# Patient Record
Sex: Male | Born: 1950 | Race: Black or African American | Hispanic: No | Marital: Married | Smoking: Former smoker
Health system: Southern US, Community
[De-identification: ages and names within clinical notes are randomized; demographics above are authoritative.]

## PROBLEM LIST (undated history)

## (undated) DIAGNOSIS — I10 Essential (primary) hypertension: Secondary | ICD-10-CM

## (undated) DIAGNOSIS — E119 Type 2 diabetes mellitus without complications: Secondary | ICD-10-CM

## (undated) DIAGNOSIS — E7800 Pure hypercholesterolemia, unspecified: Secondary | ICD-10-CM

---

## 2008-06-09 ENCOUNTER — Emergency Department: Payer: Self-pay | Admitting: Emergency Medicine

## 2008-06-10 ENCOUNTER — Inpatient Hospital Stay: Payer: Self-pay | Admitting: Urology

## 2008-07-03 ENCOUNTER — Ambulatory Visit: Payer: Self-pay | Admitting: Urology

## 2008-07-10 ENCOUNTER — Ambulatory Visit: Payer: Self-pay | Admitting: Urology

## 2008-07-25 ENCOUNTER — Ambulatory Visit: Payer: Self-pay | Admitting: Urology

## 2008-07-30 ENCOUNTER — Ambulatory Visit: Payer: Self-pay | Admitting: Urology

## 2008-09-03 ENCOUNTER — Ambulatory Visit: Payer: Self-pay | Admitting: Urology

## 2009-01-08 ENCOUNTER — Ambulatory Visit: Payer: Self-pay | Admitting: Urology

## 2009-04-24 ENCOUNTER — Ambulatory Visit: Payer: Self-pay | Admitting: Urology

## 2009-10-28 ENCOUNTER — Ambulatory Visit: Payer: Self-pay | Admitting: Urology

## 2009-12-14 ENCOUNTER — Ambulatory Visit: Payer: Self-pay | Admitting: Internal Medicine

## 2010-11-26 IMAGING — CR DG ABDOMEN 1V
1 series · 2 of 2 positions shown · non-contrast
Comparison: none

REASON FOR EXAM: calculus of kidney
COMMENTS:

[Series 1: view not recorded · 0.17mm/px · 2 of 2 slices shown]
[im 1/2]
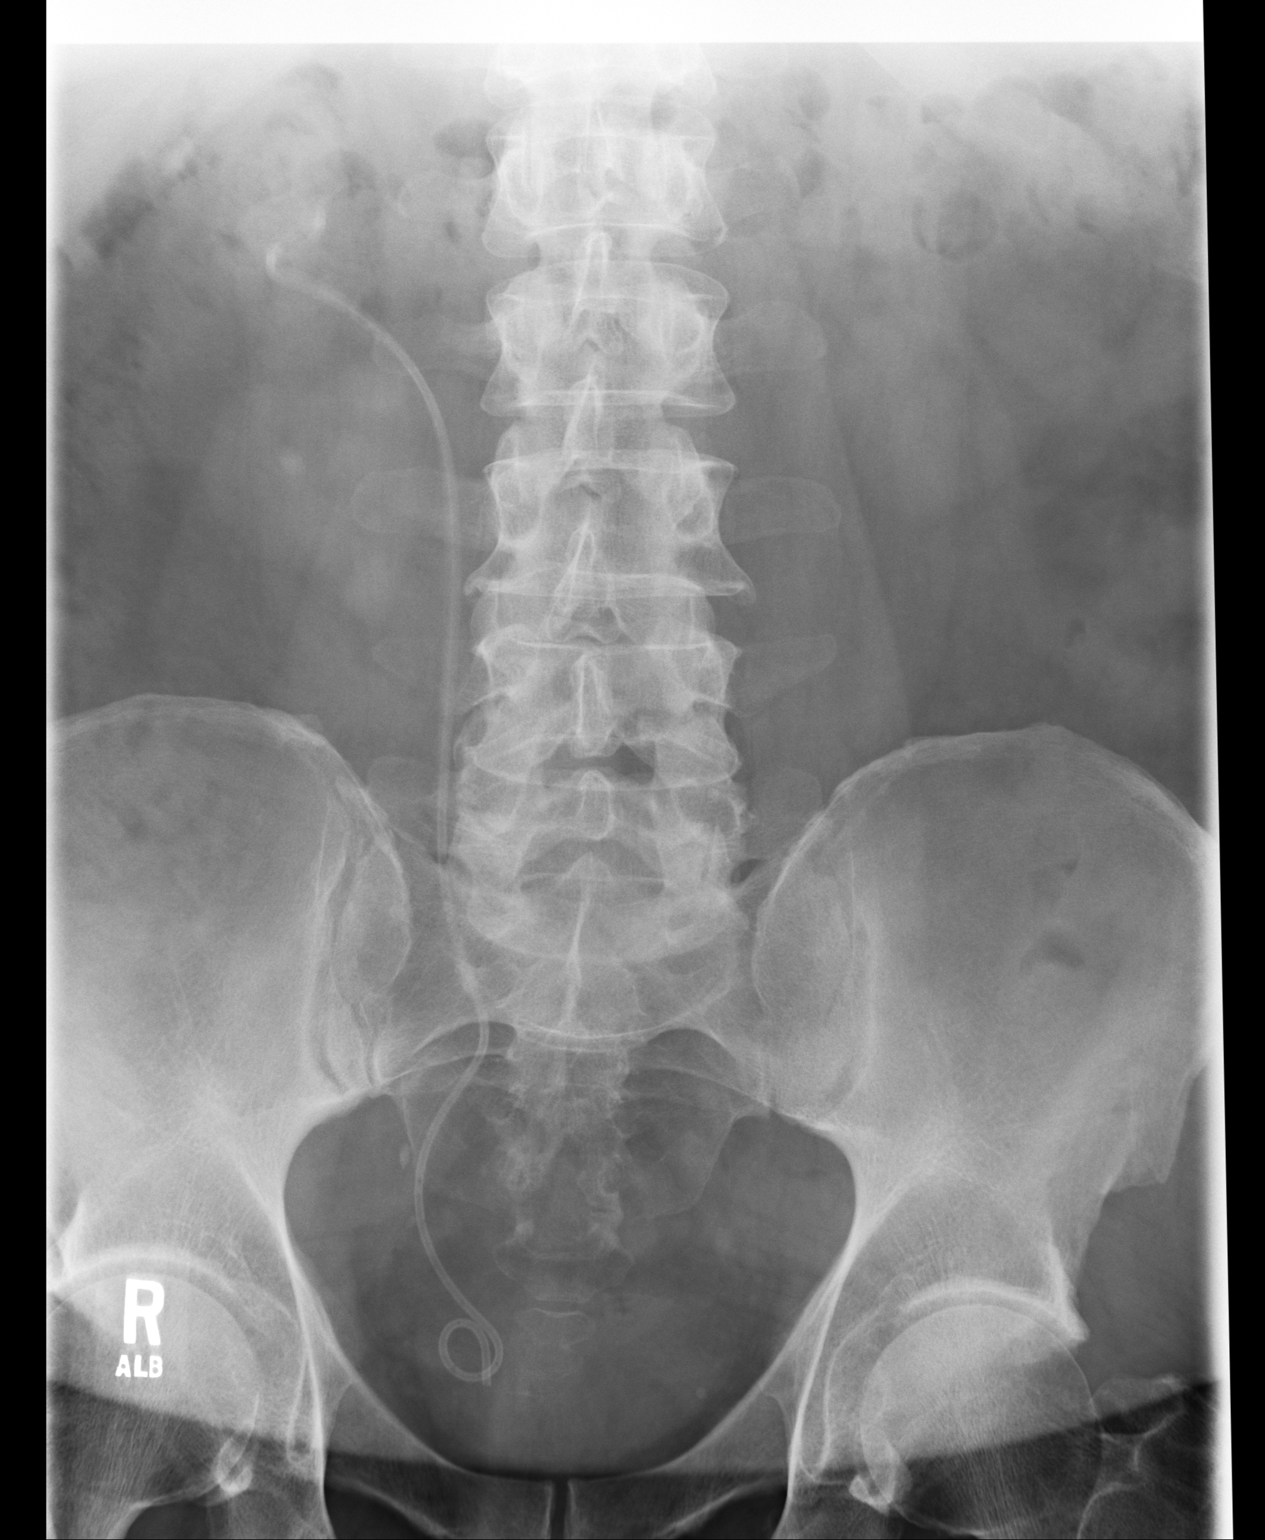
[im 2/2]
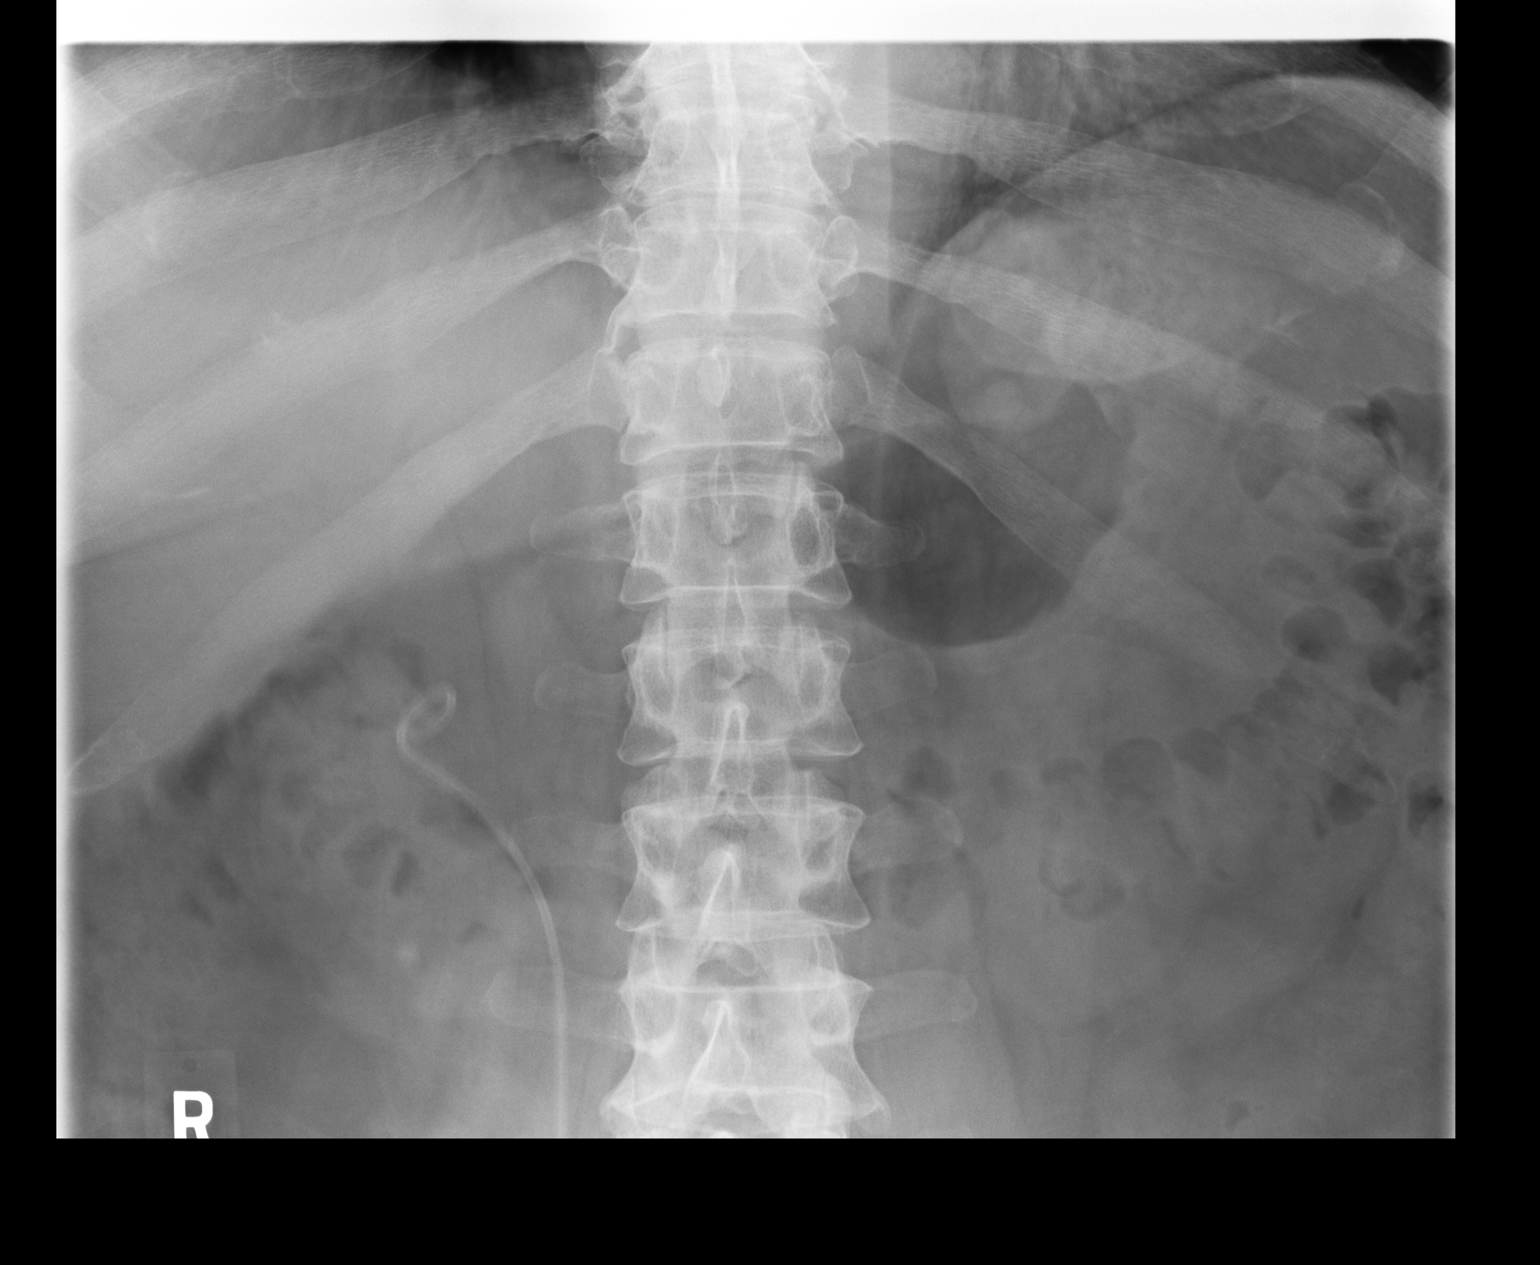

[2 of 2 positions shown; findings below may reference images not displayed]

PROCEDURE:     MDR - MDR KIDNEY URETER BLADDER  - July 03, 2008 [DATE]

RESULT:     Comparison is made to the prior exam of 06/12/2008.

A double-J stent is again noted on the right. There is a calcific density at
the lower pole of the right kidney compatible with a renal stone measuring 9
mm at maximum diameter. At the level of the sacrum, on the right, there is
an apparent density associated with the stent and likely representing a
distal right ureteral stone. No left renal or ureteral calcifications are
identified.
IMPRESSION: 1.  Right nephrolithiasis.
2.  Probable stone adjacent to the stent on the right at the level of the
upper sacrum.

## 2011-09-02 ENCOUNTER — Ambulatory Visit: Payer: Self-pay | Admitting: Urology

## 2013-09-05 DIAGNOSIS — E66813 Obesity, class 3: Secondary | ICD-10-CM | POA: Insufficient documentation

## 2013-09-05 DIAGNOSIS — IMO0002 Reserved for concepts with insufficient information to code with codable children: Secondary | ICD-10-CM | POA: Insufficient documentation

## 2013-12-02 DIAGNOSIS — M109 Gout, unspecified: Secondary | ICD-10-CM | POA: Insufficient documentation

## 2014-01-29 DIAGNOSIS — G4733 Obstructive sleep apnea (adult) (pediatric): Secondary | ICD-10-CM | POA: Insufficient documentation

## 2014-07-11 DIAGNOSIS — I10 Essential (primary) hypertension: Secondary | ICD-10-CM | POA: Insufficient documentation

## 2015-03-19 DIAGNOSIS — E1122 Type 2 diabetes mellitus with diabetic chronic kidney disease: Secondary | ICD-10-CM | POA: Diagnosis not present

## 2015-03-19 DIAGNOSIS — N183 Chronic kidney disease, stage 3 (moderate): Secondary | ICD-10-CM | POA: Diagnosis not present

## 2015-03-26 DIAGNOSIS — N183 Chronic kidney disease, stage 3 (moderate): Secondary | ICD-10-CM | POA: Diagnosis not present

## 2015-03-26 DIAGNOSIS — E1122 Type 2 diabetes mellitus with diabetic chronic kidney disease: Secondary | ICD-10-CM | POA: Diagnosis not present

## 2015-03-26 DIAGNOSIS — E785 Hyperlipidemia, unspecified: Secondary | ICD-10-CM | POA: Diagnosis not present

## 2015-06-08 DIAGNOSIS — I10 Essential (primary) hypertension: Secondary | ICD-10-CM | POA: Diagnosis not present

## 2015-06-08 DIAGNOSIS — N182 Chronic kidney disease, stage 2 (mild): Secondary | ICD-10-CM | POA: Diagnosis not present

## 2015-06-08 DIAGNOSIS — G252 Other specified forms of tremor: Secondary | ICD-10-CM | POA: Diagnosis not present

## 2015-06-08 DIAGNOSIS — E1122 Type 2 diabetes mellitus with diabetic chronic kidney disease: Secondary | ICD-10-CM | POA: Diagnosis not present

## 2015-06-08 DIAGNOSIS — E782 Mixed hyperlipidemia: Secondary | ICD-10-CM | POA: Diagnosis not present

## 2015-06-08 DIAGNOSIS — M1A0710 Idiopathic chronic gout, right ankle and foot, without tophus (tophi): Secondary | ICD-10-CM | POA: Diagnosis not present

## 2015-06-08 DIAGNOSIS — G4733 Obstructive sleep apnea (adult) (pediatric): Secondary | ICD-10-CM | POA: Diagnosis not present

## 2015-06-08 DIAGNOSIS — N183 Chronic kidney disease, stage 3 (moderate): Secondary | ICD-10-CM | POA: Diagnosis not present

## 2015-06-08 DIAGNOSIS — E1165 Type 2 diabetes mellitus with hyperglycemia: Secondary | ICD-10-CM | POA: Diagnosis not present

## 2015-06-25 DIAGNOSIS — E785 Hyperlipidemia, unspecified: Secondary | ICD-10-CM | POA: Diagnosis not present

## 2015-06-25 DIAGNOSIS — N183 Chronic kidney disease, stage 3 (moderate): Secondary | ICD-10-CM | POA: Diagnosis not present

## 2015-06-25 DIAGNOSIS — E1122 Type 2 diabetes mellitus with diabetic chronic kidney disease: Secondary | ICD-10-CM | POA: Diagnosis not present

## 2015-07-16 DIAGNOSIS — G250 Essential tremor: Secondary | ICD-10-CM | POA: Insufficient documentation

## 2015-07-16 DIAGNOSIS — R251 Tremor, unspecified: Secondary | ICD-10-CM | POA: Insufficient documentation

## 2015-09-21 DIAGNOSIS — N183 Chronic kidney disease, stage 3 (moderate): Secondary | ICD-10-CM | POA: Diagnosis not present

## 2015-09-21 DIAGNOSIS — E1122 Type 2 diabetes mellitus with diabetic chronic kidney disease: Secondary | ICD-10-CM | POA: Diagnosis not present

## 2015-09-29 DIAGNOSIS — E785 Hyperlipidemia, unspecified: Secondary | ICD-10-CM | POA: Diagnosis not present

## 2015-09-29 DIAGNOSIS — E1165 Type 2 diabetes mellitus with hyperglycemia: Secondary | ICD-10-CM | POA: Diagnosis not present

## 2015-09-29 DIAGNOSIS — E1122 Type 2 diabetes mellitus with diabetic chronic kidney disease: Secondary | ICD-10-CM | POA: Diagnosis not present

## 2015-09-29 DIAGNOSIS — N183 Chronic kidney disease, stage 3 (moderate): Secondary | ICD-10-CM | POA: Diagnosis not present

## 2015-12-09 DIAGNOSIS — M1A0710 Idiopathic chronic gout, right ankle and foot, without tophus (tophi): Secondary | ICD-10-CM | POA: Diagnosis not present

## 2015-12-09 DIAGNOSIS — G250 Essential tremor: Secondary | ICD-10-CM | POA: Diagnosis not present

## 2015-12-09 DIAGNOSIS — I10 Essential (primary) hypertension: Secondary | ICD-10-CM | POA: Diagnosis not present

## 2015-12-09 DIAGNOSIS — E1165 Type 2 diabetes mellitus with hyperglycemia: Secondary | ICD-10-CM | POA: Diagnosis not present

## 2015-12-09 DIAGNOSIS — E1122 Type 2 diabetes mellitus with diabetic chronic kidney disease: Secondary | ICD-10-CM | POA: Diagnosis not present

## 2015-12-09 DIAGNOSIS — E782 Mixed hyperlipidemia: Secondary | ICD-10-CM | POA: Diagnosis not present

## 2015-12-09 DIAGNOSIS — N182 Chronic kidney disease, stage 2 (mild): Secondary | ICD-10-CM | POA: Diagnosis not present

## 2015-12-22 DIAGNOSIS — E1165 Type 2 diabetes mellitus with hyperglycemia: Secondary | ICD-10-CM | POA: Diagnosis not present

## 2015-12-29 DIAGNOSIS — E785 Hyperlipidemia, unspecified: Secondary | ICD-10-CM | POA: Diagnosis not present

## 2015-12-29 DIAGNOSIS — N183 Chronic kidney disease, stage 3 (moderate): Secondary | ICD-10-CM | POA: Diagnosis not present

## 2015-12-29 DIAGNOSIS — E1165 Type 2 diabetes mellitus with hyperglycemia: Secondary | ICD-10-CM | POA: Diagnosis not present

## 2015-12-29 DIAGNOSIS — Z794 Long term (current) use of insulin: Secondary | ICD-10-CM | POA: Diagnosis not present

## 2015-12-29 DIAGNOSIS — E1122 Type 2 diabetes mellitus with diabetic chronic kidney disease: Secondary | ICD-10-CM | POA: Diagnosis not present

## 2016-01-14 DIAGNOSIS — G250 Essential tremor: Secondary | ICD-10-CM | POA: Diagnosis not present

## 2016-01-14 DIAGNOSIS — E1165 Type 2 diabetes mellitus with hyperglycemia: Secondary | ICD-10-CM | POA: Diagnosis not present

## 2016-03-22 DIAGNOSIS — E1165 Type 2 diabetes mellitus with hyperglycemia: Secondary | ICD-10-CM | POA: Diagnosis not present

## 2016-03-22 DIAGNOSIS — Z794 Long term (current) use of insulin: Secondary | ICD-10-CM | POA: Diagnosis not present

## 2016-03-29 DIAGNOSIS — E1165 Type 2 diabetes mellitus with hyperglycemia: Secondary | ICD-10-CM | POA: Diagnosis not present

## 2016-03-29 DIAGNOSIS — E785 Hyperlipidemia, unspecified: Secondary | ICD-10-CM | POA: Diagnosis not present

## 2016-03-29 DIAGNOSIS — E1122 Type 2 diabetes mellitus with diabetic chronic kidney disease: Secondary | ICD-10-CM | POA: Diagnosis not present

## 2016-03-29 DIAGNOSIS — N183 Chronic kidney disease, stage 3 (moderate): Secondary | ICD-10-CM | POA: Diagnosis not present

## 2016-04-18 DIAGNOSIS — E876 Hypokalemia: Secondary | ICD-10-CM | POA: Diagnosis not present

## 2016-06-08 DIAGNOSIS — Z125 Encounter for screening for malignant neoplasm of prostate: Secondary | ICD-10-CM | POA: Diagnosis not present

## 2016-06-08 DIAGNOSIS — E1122 Type 2 diabetes mellitus with diabetic chronic kidney disease: Secondary | ICD-10-CM | POA: Diagnosis not present

## 2016-06-08 DIAGNOSIS — E1165 Type 2 diabetes mellitus with hyperglycemia: Secondary | ICD-10-CM | POA: Diagnosis not present

## 2016-06-08 DIAGNOSIS — G4733 Obstructive sleep apnea (adult) (pediatric): Secondary | ICD-10-CM | POA: Diagnosis not present

## 2016-06-08 DIAGNOSIS — E782 Mixed hyperlipidemia: Secondary | ICD-10-CM | POA: Diagnosis not present

## 2016-06-08 DIAGNOSIS — M1A0710 Idiopathic chronic gout, right ankle and foot, without tophus (tophi): Secondary | ICD-10-CM | POA: Diagnosis not present

## 2016-06-08 DIAGNOSIS — N183 Chronic kidney disease, stage 3 (moderate): Secondary | ICD-10-CM | POA: Diagnosis not present

## 2016-06-08 DIAGNOSIS — I10 Essential (primary) hypertension: Secondary | ICD-10-CM | POA: Diagnosis not present

## 2016-06-08 DIAGNOSIS — G250 Essential tremor: Secondary | ICD-10-CM | POA: Diagnosis not present

## 2016-06-30 DIAGNOSIS — E1121 Type 2 diabetes mellitus with diabetic nephropathy: Secondary | ICD-10-CM | POA: Diagnosis not present

## 2016-06-30 DIAGNOSIS — I10 Essential (primary) hypertension: Secondary | ICD-10-CM | POA: Diagnosis not present

## 2016-07-05 DIAGNOSIS — E785 Hyperlipidemia, unspecified: Secondary | ICD-10-CM | POA: Diagnosis not present

## 2016-07-05 DIAGNOSIS — E1165 Type 2 diabetes mellitus with hyperglycemia: Secondary | ICD-10-CM | POA: Diagnosis not present

## 2016-07-05 DIAGNOSIS — N183 Chronic kidney disease, stage 3 (moderate): Secondary | ICD-10-CM | POA: Diagnosis not present

## 2016-07-05 DIAGNOSIS — E1122 Type 2 diabetes mellitus with diabetic chronic kidney disease: Secondary | ICD-10-CM | POA: Diagnosis not present

## 2016-07-14 DIAGNOSIS — G250 Essential tremor: Secondary | ICD-10-CM | POA: Diagnosis not present

## 2016-09-26 DIAGNOSIS — E1122 Type 2 diabetes mellitus with diabetic chronic kidney disease: Secondary | ICD-10-CM | POA: Diagnosis not present

## 2016-09-26 DIAGNOSIS — N183 Chronic kidney disease, stage 3 (moderate): Secondary | ICD-10-CM | POA: Diagnosis not present

## 2016-10-03 DIAGNOSIS — E1122 Type 2 diabetes mellitus with diabetic chronic kidney disease: Secondary | ICD-10-CM | POA: Diagnosis not present

## 2016-10-03 DIAGNOSIS — E785 Hyperlipidemia, unspecified: Secondary | ICD-10-CM | POA: Diagnosis not present

## 2016-10-03 DIAGNOSIS — N183 Chronic kidney disease, stage 3 (moderate): Secondary | ICD-10-CM | POA: Diagnosis not present

## 2016-10-06 DIAGNOSIS — G250 Essential tremor: Secondary | ICD-10-CM | POA: Diagnosis not present

## 2016-12-09 DIAGNOSIS — R972 Elevated prostate specific antigen [PSA]: Secondary | ICD-10-CM | POA: Diagnosis not present

## 2016-12-09 DIAGNOSIS — E1122 Type 2 diabetes mellitus with diabetic chronic kidney disease: Secondary | ICD-10-CM | POA: Diagnosis not present

## 2016-12-09 DIAGNOSIS — M1A0710 Idiopathic chronic gout, right ankle and foot, without tophus (tophi): Secondary | ICD-10-CM | POA: Diagnosis not present

## 2016-12-09 DIAGNOSIS — G250 Essential tremor: Secondary | ICD-10-CM | POA: Diagnosis not present

## 2016-12-09 DIAGNOSIS — I10 Essential (primary) hypertension: Secondary | ICD-10-CM | POA: Diagnosis not present

## 2016-12-09 DIAGNOSIS — Z23 Encounter for immunization: Secondary | ICD-10-CM | POA: Diagnosis not present

## 2016-12-09 DIAGNOSIS — G4733 Obstructive sleep apnea (adult) (pediatric): Secondary | ICD-10-CM | POA: Diagnosis not present

## 2016-12-09 DIAGNOSIS — Z1211 Encounter for screening for malignant neoplasm of colon: Secondary | ICD-10-CM | POA: Diagnosis not present

## 2016-12-09 DIAGNOSIS — E782 Mixed hyperlipidemia: Secondary | ICD-10-CM | POA: Diagnosis not present

## 2016-12-28 DIAGNOSIS — Z1211 Encounter for screening for malignant neoplasm of colon: Secondary | ICD-10-CM | POA: Diagnosis not present

## 2017-01-05 DIAGNOSIS — E8881 Metabolic syndrome: Secondary | ICD-10-CM | POA: Diagnosis not present

## 2017-01-05 DIAGNOSIS — E1122 Type 2 diabetes mellitus with diabetic chronic kidney disease: Secondary | ICD-10-CM | POA: Diagnosis not present

## 2017-01-05 DIAGNOSIS — G479 Sleep disorder, unspecified: Secondary | ICD-10-CM | POA: Insufficient documentation

## 2017-01-05 DIAGNOSIS — E785 Hyperlipidemia, unspecified: Secondary | ICD-10-CM | POA: Diagnosis not present

## 2017-01-05 DIAGNOSIS — G250 Essential tremor: Secondary | ICD-10-CM | POA: Diagnosis not present

## 2017-01-05 DIAGNOSIS — N183 Chronic kidney disease, stage 3 (moderate): Secondary | ICD-10-CM | POA: Diagnosis not present

## 2017-01-05 DIAGNOSIS — Z6841 Body Mass Index (BMI) 40.0 and over, adult: Secondary | ICD-10-CM | POA: Diagnosis not present

## 2017-04-07 DIAGNOSIS — N183 Chronic kidney disease, stage 3 (moderate): Secondary | ICD-10-CM | POA: Diagnosis not present

## 2017-04-07 DIAGNOSIS — E1165 Type 2 diabetes mellitus with hyperglycemia: Secondary | ICD-10-CM | POA: Diagnosis not present

## 2017-04-07 DIAGNOSIS — E1122 Type 2 diabetes mellitus with diabetic chronic kidney disease: Secondary | ICD-10-CM | POA: Diagnosis not present

## 2017-04-07 DIAGNOSIS — E8881 Metabolic syndrome: Secondary | ICD-10-CM | POA: Diagnosis not present

## 2017-04-07 DIAGNOSIS — Z6841 Body Mass Index (BMI) 40.0 and over, adult: Secondary | ICD-10-CM | POA: Diagnosis not present

## 2017-04-07 DIAGNOSIS — E785 Hyperlipidemia, unspecified: Secondary | ICD-10-CM | POA: Diagnosis not present

## 2017-04-07 DIAGNOSIS — E1129 Type 2 diabetes mellitus with other diabetic kidney complication: Secondary | ICD-10-CM | POA: Diagnosis not present

## 2017-06-08 DIAGNOSIS — Z6841 Body Mass Index (BMI) 40.0 and over, adult: Secondary | ICD-10-CM | POA: Diagnosis not present

## 2017-06-08 DIAGNOSIS — Z0000 Encounter for general adult medical examination without abnormal findings: Secondary | ICD-10-CM | POA: Diagnosis not present

## 2017-06-08 DIAGNOSIS — G250 Essential tremor: Secondary | ICD-10-CM | POA: Diagnosis not present

## 2017-06-08 DIAGNOSIS — I10 Essential (primary) hypertension: Secondary | ICD-10-CM | POA: Diagnosis not present

## 2017-06-08 DIAGNOSIS — R972 Elevated prostate specific antigen [PSA]: Secondary | ICD-10-CM | POA: Diagnosis not present

## 2017-06-08 DIAGNOSIS — M1A0710 Idiopathic chronic gout, right ankle and foot, without tophus (tophi): Secondary | ICD-10-CM | POA: Diagnosis not present

## 2017-06-08 DIAGNOSIS — G4733 Obstructive sleep apnea (adult) (pediatric): Secondary | ICD-10-CM | POA: Diagnosis not present

## 2017-06-08 DIAGNOSIS — E1122 Type 2 diabetes mellitus with diabetic chronic kidney disease: Secondary | ICD-10-CM | POA: Diagnosis not present

## 2017-06-08 DIAGNOSIS — E782 Mixed hyperlipidemia: Secondary | ICD-10-CM | POA: Diagnosis not present

## 2017-07-06 DIAGNOSIS — G479 Sleep disorder, unspecified: Secondary | ICD-10-CM | POA: Diagnosis not present

## 2017-07-06 DIAGNOSIS — G250 Essential tremor: Secondary | ICD-10-CM | POA: Diagnosis not present

## 2017-09-01 DIAGNOSIS — N183 Chronic kidney disease, stage 3 (moderate): Secondary | ICD-10-CM | POA: Diagnosis not present

## 2017-09-01 DIAGNOSIS — E782 Mixed hyperlipidemia: Secondary | ICD-10-CM | POA: Diagnosis not present

## 2017-09-01 DIAGNOSIS — E1122 Type 2 diabetes mellitus with diabetic chronic kidney disease: Secondary | ICD-10-CM | POA: Diagnosis not present

## 2017-09-01 DIAGNOSIS — I129 Hypertensive chronic kidney disease with stage 1 through stage 4 chronic kidney disease, or unspecified chronic kidney disease: Secondary | ICD-10-CM | POA: Diagnosis not present

## 2017-09-01 DIAGNOSIS — E1165 Type 2 diabetes mellitus with hyperglycemia: Secondary | ICD-10-CM | POA: Diagnosis not present

## 2017-12-01 DIAGNOSIS — E785 Hyperlipidemia, unspecified: Secondary | ICD-10-CM | POA: Diagnosis not present

## 2017-12-01 DIAGNOSIS — E1165 Type 2 diabetes mellitus with hyperglycemia: Secondary | ICD-10-CM | POA: Diagnosis not present

## 2017-12-01 DIAGNOSIS — I129 Hypertensive chronic kidney disease with stage 1 through stage 4 chronic kidney disease, or unspecified chronic kidney disease: Secondary | ICD-10-CM | POA: Diagnosis not present

## 2017-12-01 DIAGNOSIS — E1122 Type 2 diabetes mellitus with diabetic chronic kidney disease: Secondary | ICD-10-CM | POA: Diagnosis not present

## 2017-12-01 DIAGNOSIS — E1169 Type 2 diabetes mellitus with other specified complication: Secondary | ICD-10-CM | POA: Diagnosis not present

## 2017-12-01 DIAGNOSIS — N183 Chronic kidney disease, stage 3 (moderate): Secondary | ICD-10-CM | POA: Diagnosis not present

## 2017-12-12 DIAGNOSIS — E785 Hyperlipidemia, unspecified: Secondary | ICD-10-CM | POA: Insufficient documentation

## 2017-12-12 DIAGNOSIS — N183 Chronic kidney disease, stage 3 unspecified: Secondary | ICD-10-CM | POA: Insufficient documentation

## 2017-12-12 DIAGNOSIS — E1122 Type 2 diabetes mellitus with diabetic chronic kidney disease: Secondary | ICD-10-CM | POA: Insufficient documentation

## 2017-12-12 DIAGNOSIS — E1169 Type 2 diabetes mellitus with other specified complication: Secondary | ICD-10-CM | POA: Insufficient documentation

## 2017-12-13 DIAGNOSIS — E1122 Type 2 diabetes mellitus with diabetic chronic kidney disease: Secondary | ICD-10-CM | POA: Diagnosis not present

## 2017-12-13 DIAGNOSIS — G250 Essential tremor: Secondary | ICD-10-CM | POA: Diagnosis not present

## 2017-12-13 DIAGNOSIS — N183 Chronic kidney disease, stage 3 (moderate): Secondary | ICD-10-CM | POA: Diagnosis not present

## 2017-12-13 DIAGNOSIS — I10 Essential (primary) hypertension: Secondary | ICD-10-CM | POA: Diagnosis not present

## 2017-12-13 DIAGNOSIS — Z6841 Body Mass Index (BMI) 40.0 and over, adult: Secondary | ICD-10-CM | POA: Diagnosis not present

## 2017-12-13 DIAGNOSIS — E1165 Type 2 diabetes mellitus with hyperglycemia: Secondary | ICD-10-CM | POA: Diagnosis not present

## 2017-12-13 DIAGNOSIS — M1A0710 Idiopathic chronic gout, right ankle and foot, without tophus (tophi): Secondary | ICD-10-CM | POA: Diagnosis not present

## 2017-12-13 DIAGNOSIS — G4733 Obstructive sleep apnea (adult) (pediatric): Secondary | ICD-10-CM | POA: Diagnosis not present

## 2017-12-13 DIAGNOSIS — E782 Mixed hyperlipidemia: Secondary | ICD-10-CM | POA: Diagnosis not present

## 2018-01-11 DIAGNOSIS — G250 Essential tremor: Secondary | ICD-10-CM | POA: Diagnosis not present

## 2018-01-11 DIAGNOSIS — G479 Sleep disorder, unspecified: Secondary | ICD-10-CM | POA: Diagnosis not present

## 2018-03-09 DIAGNOSIS — E785 Hyperlipidemia, unspecified: Secondary | ICD-10-CM | POA: Diagnosis not present

## 2018-03-09 DIAGNOSIS — E1122 Type 2 diabetes mellitus with diabetic chronic kidney disease: Secondary | ICD-10-CM | POA: Diagnosis not present

## 2018-03-09 DIAGNOSIS — E1165 Type 2 diabetes mellitus with hyperglycemia: Secondary | ICD-10-CM | POA: Diagnosis not present

## 2018-03-09 DIAGNOSIS — E1169 Type 2 diabetes mellitus with other specified complication: Secondary | ICD-10-CM | POA: Diagnosis not present

## 2018-04-19 DIAGNOSIS — G479 Sleep disorder, unspecified: Secondary | ICD-10-CM | POA: Diagnosis not present

## 2018-04-19 DIAGNOSIS — G250 Essential tremor: Secondary | ICD-10-CM | POA: Diagnosis not present

## 2018-05-31 DIAGNOSIS — E1122 Type 2 diabetes mellitus with diabetic chronic kidney disease: Secondary | ICD-10-CM | POA: Diagnosis not present

## 2018-05-31 DIAGNOSIS — E1169 Type 2 diabetes mellitus with other specified complication: Secondary | ICD-10-CM | POA: Diagnosis not present

## 2018-05-31 DIAGNOSIS — E1165 Type 2 diabetes mellitus with hyperglycemia: Secondary | ICD-10-CM | POA: Diagnosis not present

## 2018-05-31 DIAGNOSIS — E785 Hyperlipidemia, unspecified: Secondary | ICD-10-CM | POA: Diagnosis not present

## 2018-06-08 DIAGNOSIS — N183 Chronic kidney disease, stage 3 (moderate): Secondary | ICD-10-CM | POA: Diagnosis not present

## 2018-06-08 DIAGNOSIS — I129 Hypertensive chronic kidney disease with stage 1 through stage 4 chronic kidney disease, or unspecified chronic kidney disease: Secondary | ICD-10-CM | POA: Diagnosis not present

## 2018-06-08 DIAGNOSIS — E1122 Type 2 diabetes mellitus with diabetic chronic kidney disease: Secondary | ICD-10-CM | POA: Diagnosis not present

## 2018-06-08 DIAGNOSIS — E1169 Type 2 diabetes mellitus with other specified complication: Secondary | ICD-10-CM | POA: Diagnosis not present

## 2018-06-08 DIAGNOSIS — E1165 Type 2 diabetes mellitus with hyperglycemia: Secondary | ICD-10-CM | POA: Diagnosis not present

## 2018-06-08 DIAGNOSIS — E785 Hyperlipidemia, unspecified: Secondary | ICD-10-CM | POA: Diagnosis not present

## 2018-06-12 DIAGNOSIS — Z6841 Body Mass Index (BMI) 40.0 and over, adult: Secondary | ICD-10-CM | POA: Diagnosis not present

## 2018-06-12 DIAGNOSIS — G250 Essential tremor: Secondary | ICD-10-CM | POA: Diagnosis not present

## 2018-06-12 DIAGNOSIS — E782 Mixed hyperlipidemia: Secondary | ICD-10-CM | POA: Diagnosis not present

## 2018-06-12 DIAGNOSIS — Z0000 Encounter for general adult medical examination without abnormal findings: Secondary | ICD-10-CM | POA: Diagnosis not present

## 2018-06-12 DIAGNOSIS — G4733 Obstructive sleep apnea (adult) (pediatric): Secondary | ICD-10-CM | POA: Diagnosis not present

## 2018-06-12 DIAGNOSIS — Z125 Encounter for screening for malignant neoplasm of prostate: Secondary | ICD-10-CM | POA: Diagnosis not present

## 2018-06-12 DIAGNOSIS — I10 Essential (primary) hypertension: Secondary | ICD-10-CM | POA: Diagnosis not present

## 2018-06-12 DIAGNOSIS — M1A0710 Idiopathic chronic gout, right ankle and foot, without tophus (tophi): Secondary | ICD-10-CM | POA: Diagnosis not present

## 2018-06-12 DIAGNOSIS — E1122 Type 2 diabetes mellitus with diabetic chronic kidney disease: Secondary | ICD-10-CM | POA: Diagnosis not present

## 2018-08-16 DIAGNOSIS — G250 Essential tremor: Secondary | ICD-10-CM | POA: Diagnosis not present

## 2018-09-18 DIAGNOSIS — E1165 Type 2 diabetes mellitus with hyperglycemia: Secondary | ICD-10-CM | POA: Diagnosis not present

## 2018-09-18 DIAGNOSIS — N183 Chronic kidney disease, stage 3 (moderate): Secondary | ICD-10-CM | POA: Diagnosis not present

## 2018-09-18 DIAGNOSIS — E785 Hyperlipidemia, unspecified: Secondary | ICD-10-CM | POA: Diagnosis not present

## 2018-09-18 DIAGNOSIS — I129 Hypertensive chronic kidney disease with stage 1 through stage 4 chronic kidney disease, or unspecified chronic kidney disease: Secondary | ICD-10-CM | POA: Diagnosis not present

## 2018-09-18 DIAGNOSIS — E1169 Type 2 diabetes mellitus with other specified complication: Secondary | ICD-10-CM | POA: Diagnosis not present

## 2018-09-18 DIAGNOSIS — E1122 Type 2 diabetes mellitus with diabetic chronic kidney disease: Secondary | ICD-10-CM | POA: Diagnosis not present

## 2018-12-13 DIAGNOSIS — Z23 Encounter for immunization: Secondary | ICD-10-CM | POA: Diagnosis not present

## 2018-12-13 DIAGNOSIS — R195 Other fecal abnormalities: Secondary | ICD-10-CM | POA: Diagnosis not present

## 2018-12-13 DIAGNOSIS — M1A0710 Idiopathic chronic gout, right ankle and foot, without tophus (tophi): Secondary | ICD-10-CM | POA: Diagnosis not present

## 2018-12-13 DIAGNOSIS — I129 Hypertensive chronic kidney disease with stage 1 through stage 4 chronic kidney disease, or unspecified chronic kidney disease: Secondary | ICD-10-CM | POA: Diagnosis not present

## 2018-12-13 DIAGNOSIS — N183 Chronic kidney disease, stage 3 (moderate): Secondary | ICD-10-CM | POA: Diagnosis not present

## 2018-12-13 DIAGNOSIS — E1122 Type 2 diabetes mellitus with diabetic chronic kidney disease: Secondary | ICD-10-CM | POA: Diagnosis not present

## 2018-12-13 DIAGNOSIS — Z6841 Body Mass Index (BMI) 40.0 and over, adult: Secondary | ICD-10-CM | POA: Diagnosis not present

## 2018-12-13 DIAGNOSIS — G4733 Obstructive sleep apnea (adult) (pediatric): Secondary | ICD-10-CM | POA: Diagnosis not present

## 2018-12-25 DIAGNOSIS — E1165 Type 2 diabetes mellitus with hyperglycemia: Secondary | ICD-10-CM | POA: Diagnosis not present

## 2018-12-25 DIAGNOSIS — E785 Hyperlipidemia, unspecified: Secondary | ICD-10-CM | POA: Diagnosis not present

## 2018-12-25 DIAGNOSIS — E1169 Type 2 diabetes mellitus with other specified complication: Secondary | ICD-10-CM | POA: Diagnosis not present

## 2018-12-25 DIAGNOSIS — E1122 Type 2 diabetes mellitus with diabetic chronic kidney disease: Secondary | ICD-10-CM | POA: Diagnosis not present

## 2019-02-21 DIAGNOSIS — G250 Essential tremor: Secondary | ICD-10-CM | POA: Diagnosis not present

## 2019-02-21 DIAGNOSIS — G479 Sleep disorder, unspecified: Secondary | ICD-10-CM | POA: Diagnosis not present

## 2019-06-12 DIAGNOSIS — E1122 Type 2 diabetes mellitus with diabetic chronic kidney disease: Secondary | ICD-10-CM | POA: Diagnosis not present

## 2019-06-12 DIAGNOSIS — Z87891 Personal history of nicotine dependence: Secondary | ICD-10-CM | POA: Diagnosis not present

## 2019-06-12 DIAGNOSIS — Z6841 Body Mass Index (BMI) 40.0 and over, adult: Secondary | ICD-10-CM | POA: Diagnosis not present

## 2019-06-12 DIAGNOSIS — G4733 Obstructive sleep apnea (adult) (pediatric): Secondary | ICD-10-CM | POA: Diagnosis not present

## 2019-06-12 DIAGNOSIS — E782 Mixed hyperlipidemia: Secondary | ICD-10-CM | POA: Diagnosis not present

## 2019-06-12 DIAGNOSIS — N183 Chronic kidney disease, stage 3 unspecified: Secondary | ICD-10-CM | POA: Diagnosis not present

## 2019-06-12 DIAGNOSIS — I129 Hypertensive chronic kidney disease with stage 1 through stage 4 chronic kidney disease, or unspecified chronic kidney disease: Secondary | ICD-10-CM | POA: Diagnosis not present

## 2019-06-12 DIAGNOSIS — M1A0710 Idiopathic chronic gout, right ankle and foot, without tophus (tophi): Secondary | ICD-10-CM | POA: Diagnosis not present

## 2019-06-12 DIAGNOSIS — Z0000 Encounter for general adult medical examination without abnormal findings: Secondary | ICD-10-CM | POA: Diagnosis not present

## 2019-06-25 DIAGNOSIS — I129 Hypertensive chronic kidney disease with stage 1 through stage 4 chronic kidney disease, or unspecified chronic kidney disease: Secondary | ICD-10-CM | POA: Diagnosis not present

## 2019-06-25 DIAGNOSIS — N183 Chronic kidney disease, stage 3 unspecified: Secondary | ICD-10-CM | POA: Diagnosis not present

## 2019-06-25 DIAGNOSIS — E1165 Type 2 diabetes mellitus with hyperglycemia: Secondary | ICD-10-CM | POA: Diagnosis not present

## 2019-06-25 DIAGNOSIS — E785 Hyperlipidemia, unspecified: Secondary | ICD-10-CM | POA: Diagnosis not present

## 2019-06-25 DIAGNOSIS — E1169 Type 2 diabetes mellitus with other specified complication: Secondary | ICD-10-CM | POA: Diagnosis not present

## 2019-06-25 DIAGNOSIS — R7989 Other specified abnormal findings of blood chemistry: Secondary | ICD-10-CM | POA: Diagnosis not present

## 2019-06-25 DIAGNOSIS — E1122 Type 2 diabetes mellitus with diabetic chronic kidney disease: Secondary | ICD-10-CM | POA: Diagnosis not present

## 2019-07-24 DIAGNOSIS — E1122 Type 2 diabetes mellitus with diabetic chronic kidney disease: Secondary | ICD-10-CM | POA: Insufficient documentation

## 2019-07-24 DIAGNOSIS — N183 Chronic kidney disease, stage 3 unspecified: Secondary | ICD-10-CM | POA: Insufficient documentation

## 2019-07-25 DIAGNOSIS — N1832 Chronic kidney disease, stage 3b: Secondary | ICD-10-CM | POA: Diagnosis not present

## 2019-07-25 DIAGNOSIS — I10 Essential (primary) hypertension: Secondary | ICD-10-CM | POA: Diagnosis not present

## 2019-07-25 DIAGNOSIS — D631 Anemia in chronic kidney disease: Secondary | ICD-10-CM | POA: Diagnosis not present

## 2019-07-25 DIAGNOSIS — E1122 Type 2 diabetes mellitus with diabetic chronic kidney disease: Secondary | ICD-10-CM | POA: Diagnosis not present

## 2019-07-26 ENCOUNTER — Other Ambulatory Visit: Payer: Self-pay | Admitting: Nephrology

## 2019-07-26 DIAGNOSIS — E1122 Type 2 diabetes mellitus with diabetic chronic kidney disease: Secondary | ICD-10-CM

## 2019-07-26 DIAGNOSIS — N1832 Chronic kidney disease, stage 3b: Secondary | ICD-10-CM

## 2019-08-02 ENCOUNTER — Ambulatory Visit
Admission: RE | Admit: 2019-08-02 | Discharge: 2019-08-02 | Disposition: A | Discharge status: Home / Self Care | Payer: Medicare HMO | Source: Ambulatory Visit | Attending: Nephrology | Admitting: Nephrology

## 2019-08-02 ENCOUNTER — Other Ambulatory Visit: Payer: Self-pay

## 2019-08-02 ENCOUNTER — Encounter (INDEPENDENT_AMBULATORY_CARE_PROVIDER_SITE_OTHER): Payer: Self-pay

## 2019-08-02 DIAGNOSIS — N281 Cyst of kidney, acquired: Secondary | ICD-10-CM | POA: Diagnosis not present

## 2019-08-02 DIAGNOSIS — E1122 Type 2 diabetes mellitus with diabetic chronic kidney disease: Secondary | ICD-10-CM | POA: Diagnosis not present

## 2019-08-02 DIAGNOSIS — N1832 Chronic kidney disease, stage 3b: Secondary | ICD-10-CM | POA: Diagnosis not present

## 2019-08-02 DIAGNOSIS — N183 Chronic kidney disease, stage 3 unspecified: Secondary | ICD-10-CM | POA: Diagnosis not present

## 2019-08-29 DIAGNOSIS — D631 Anemia in chronic kidney disease: Secondary | ICD-10-CM | POA: Diagnosis not present

## 2019-08-29 DIAGNOSIS — E1122 Type 2 diabetes mellitus with diabetic chronic kidney disease: Secondary | ICD-10-CM | POA: Diagnosis not present

## 2019-08-29 DIAGNOSIS — N1832 Chronic kidney disease, stage 3b: Secondary | ICD-10-CM | POA: Diagnosis not present

## 2019-08-29 DIAGNOSIS — I10 Essential (primary) hypertension: Secondary | ICD-10-CM | POA: Diagnosis not present

## 2019-09-04 DIAGNOSIS — D631 Anemia in chronic kidney disease: Secondary | ICD-10-CM | POA: Diagnosis not present

## 2019-09-04 DIAGNOSIS — E1122 Type 2 diabetes mellitus with diabetic chronic kidney disease: Secondary | ICD-10-CM | POA: Diagnosis not present

## 2019-09-04 DIAGNOSIS — I10 Essential (primary) hypertension: Secondary | ICD-10-CM | POA: Diagnosis not present

## 2019-09-04 DIAGNOSIS — N1832 Chronic kidney disease, stage 3b: Secondary | ICD-10-CM | POA: Diagnosis not present

## 2019-09-30 DIAGNOSIS — I10 Essential (primary) hypertension: Secondary | ICD-10-CM | POA: Diagnosis not present

## 2019-09-30 DIAGNOSIS — N1832 Chronic kidney disease, stage 3b: Secondary | ICD-10-CM | POA: Diagnosis not present

## 2019-12-18 DIAGNOSIS — Z23 Encounter for immunization: Secondary | ICD-10-CM | POA: Diagnosis not present

## 2019-12-18 DIAGNOSIS — E1122 Type 2 diabetes mellitus with diabetic chronic kidney disease: Secondary | ICD-10-CM | POA: Diagnosis not present

## 2019-12-18 DIAGNOSIS — M1A0710 Idiopathic chronic gout, right ankle and foot, without tophus (tophi): Secondary | ICD-10-CM | POA: Diagnosis not present

## 2019-12-18 DIAGNOSIS — I129 Hypertensive chronic kidney disease with stage 1 through stage 4 chronic kidney disease, or unspecified chronic kidney disease: Secondary | ICD-10-CM | POA: Diagnosis not present

## 2019-12-18 DIAGNOSIS — E782 Mixed hyperlipidemia: Secondary | ICD-10-CM | POA: Diagnosis not present

## 2019-12-18 DIAGNOSIS — Z6839 Body mass index (BMI) 39.0-39.9, adult: Secondary | ICD-10-CM | POA: Diagnosis not present

## 2019-12-18 DIAGNOSIS — N183 Chronic kidney disease, stage 3 unspecified: Secondary | ICD-10-CM | POA: Diagnosis not present

## 2019-12-18 DIAGNOSIS — G250 Essential tremor: Secondary | ICD-10-CM | POA: Diagnosis not present

## 2019-12-31 DIAGNOSIS — E785 Hyperlipidemia, unspecified: Secondary | ICD-10-CM | POA: Diagnosis not present

## 2019-12-31 DIAGNOSIS — E1165 Type 2 diabetes mellitus with hyperglycemia: Secondary | ICD-10-CM | POA: Diagnosis not present

## 2019-12-31 DIAGNOSIS — E1122 Type 2 diabetes mellitus with diabetic chronic kidney disease: Secondary | ICD-10-CM | POA: Diagnosis not present

## 2019-12-31 DIAGNOSIS — N183 Chronic kidney disease, stage 3 unspecified: Secondary | ICD-10-CM | POA: Diagnosis not present

## 2019-12-31 DIAGNOSIS — E1169 Type 2 diabetes mellitus with other specified complication: Secondary | ICD-10-CM | POA: Diagnosis not present

## 2019-12-31 DIAGNOSIS — I129 Hypertensive chronic kidney disease with stage 1 through stage 4 chronic kidney disease, or unspecified chronic kidney disease: Secondary | ICD-10-CM | POA: Diagnosis not present

## 2020-01-02 DIAGNOSIS — R195 Other fecal abnormalities: Secondary | ICD-10-CM | POA: Diagnosis not present

## 2020-01-09 DIAGNOSIS — N1832 Chronic kidney disease, stage 3b: Secondary | ICD-10-CM | POA: Diagnosis not present

## 2020-01-09 DIAGNOSIS — I10 Essential (primary) hypertension: Secondary | ICD-10-CM | POA: Diagnosis not present

## 2020-01-09 DIAGNOSIS — D631 Anemia in chronic kidney disease: Secondary | ICD-10-CM | POA: Diagnosis not present

## 2020-01-09 DIAGNOSIS — E1122 Type 2 diabetes mellitus with diabetic chronic kidney disease: Secondary | ICD-10-CM | POA: Diagnosis not present

## 2020-07-22 ENCOUNTER — Ambulatory Visit
Admission: EM | Admit: 2020-07-22 | Discharge: 2020-07-22 | Disposition: A | Discharge status: Home / Self Care | Payer: Medicare Other

## 2020-07-22 ENCOUNTER — Other Ambulatory Visit: Payer: Self-pay

## 2020-07-22 ENCOUNTER — Ambulatory Visit (INDEPENDENT_AMBULATORY_CARE_PROVIDER_SITE_OTHER): Payer: Medicare Other

## 2020-07-22 DIAGNOSIS — R0781 Pleurodynia: Secondary | ICD-10-CM

## 2020-07-22 DIAGNOSIS — W19XXXA Unspecified fall, initial encounter: Secondary | ICD-10-CM

## 2020-07-22 HISTORY — DX: Pure hypercholesterolemia, unspecified: E78.00

## 2020-07-22 HISTORY — DX: Essential (primary) hypertension: I10

## 2020-07-22 HISTORY — DX: Type 2 diabetes mellitus without complications: E11.9

## 2020-07-22 NOTE — ED Triage Notes (Signed)
Pt presents with complaints of right rib pain that started today after a fall a month ago.  Pt fell off of a ladder a month ago, states he was up 2-3 steps on the ladder. He fell onto his right side and had pain in his right ribs right after that subsided. Denies loc or head trauma after fall.  Today he laid on his right side to work on the truck, heard a pop and started having pain in his ribs again.

## 2020-07-22 NOTE — ED Provider Notes (Signed)
[facility]    CSN: [id number] Arrival date & time: 07/22/20  1856      History   Chief Complaint Chief Complaint  Patient presents with  . Fall    HPI Eric Shelton is a 70 y.o. male presenting for right sided rib pain since earlier today.  Patient says that he was doing some work on his truck and was leaning over a Chief Operating Officer.  He says that he heard a pop of the right side of his ribs at that time.  He has had persistent and constant pain since. Pain worse with twisting/rotating. No change with breathing or if he coughs.  Patient states that he injured this same area previously about a month ago.  He says that he fell about 2-3 steps off a ladder onto his right side.  Patient states that he did not have very bad pain for too long.  He says that it resolved a couple of weeks ago.  Today he has not taken anything for pain relief or ice the area.  He states that he just came to urgent care to get it checked out.  His past medical history significant for diabetes, hypertension, hyperlipidemia, and stage III CKD.  He has no other injuries, complaints or concerns.  HPI  Past Medical History:  Diagnosis Date  . Diabetes mellitus without complication (HCC)   . Hypercholesteremia   . Hypertension     There are no problems to display for this patient.   History reviewed. No pertinent surgical history.     Home Medications    Prior to Admission medications   Medication Sig Start Date End Date Taking? Authorizing Provider  allopurinol (ZYLOPRIM) 300 MG tablet Take 300 mg by mouth daily.   Yes [provider]  amLODipine (NORVASC) 10 MG tablet Take 10 mg by mouth daily.   Yes [provider]  aspirin 81 MG chewable tablet Chew by mouth daily.   Yes [provider]  cloNIDine (CATAPRES) 0.3 MG tablet Take 0.3 mg by mouth 2 (two) times daily.   Yes [provider]  Dulaglutide (TRULICITY [place]) Inject into the skin.   Yes [provider]  gabapentin (NEURONTIN) 100 MG capsule Take 100 mg by mouth 3 (three) times daily.   Yes [provider]  glipiZIDE (GLUCOTROL) 10 MG tablet Take 10 mg by mouth daily before breakfast.   Yes [provider]  lovastatin (MEVACOR) 10 MG tablet Take 10 mg by mouth at bedtime.   Yes [provider]  metoprolol (TOPROL-XL) 200 MG 24 hr tablet Take 200 mg by mouth daily.   Yes [provider]  pioglitazone (ACTOS) 15 MG tablet Take 15 mg by mouth daily.   Yes [provider]  potassium chloride (KLOR-CON) 20 MEQ packet Take by mouth 2 (two) times daily.   Yes [provider]  triamterene-hydrochlorothiazide (MAXZIDE-25) 37.5-25 MG tablet Take 1 tablet by mouth daily.   Yes [provider]  valsartan (DIOVAN) 160 MG tablet Take 160 mg by mouth daily.   Yes [provider]    Family History Family History  Problem Relation Age of Onset  . Healthy Mother   . Healthy Father     Social History Social History   Tobacco Use  . Smoking status: Former Games developer  . Smokeless tobacco: Never Used  Substance Use Topics  . Alcohol use: Not Currently     Allergies   Patient has no known allergies.  Review of Systems Review of Systems  Constitutional: Negative for fatigue.  Respiratory: Negative for cough and shortness of breath.   Cardiovascular: Positive for chest pain (right rib pain). Negative for palpitations.  Gastrointestinal: Negative for abdominal pain, nausea and vomiting.  Musculoskeletal: Negative for back pain, gait problem and joint swelling.  Skin: Negative for wound.  Neurological: Negative for dizziness, syncope, weakness, numbness and headaches.     Physical Exam Triage Vital Signs ED Triage Vitals  Enc Vitals Group     BP 07/22/20 1914 (!) 155/78     Pulse Rate 07/22/20 1914 85     Resp 07/22/20 1914 19     Temp 07/22/20 1914 98.6 F (37 C)     Temp src --      SpO2 07/22/20 1914  100 %     Weight --      Height --      Head Circumference --      Peak Flow --      Pain Score 07/22/20 1907 5     Pain Loc --      Pain Edu? --      Excl. in GC? --    No data found.  Updated Vital Signs BP (!) 155/78   Pulse 85   Temp 98.6 F (37 C)   Resp 19   SpO2 100%      Physical Exam Vitals and nursing note reviewed.  Constitutional:      General: He is not in acute distress.    Appearance: Normal appearance. He is well-developed. He is obese. He is not ill-appearing.  HENT:     Head: Normocephalic and atraumatic.  Eyes:     General: No scleral icterus.    Conjunctiva/sclera: Conjunctivae normal.  Cardiovascular:     Rate and Rhythm: Normal rate and regular rhythm.     Heart sounds: Normal heart sounds.  Pulmonary:     Effort: Pulmonary effort is normal. No respiratory distress.     Breath sounds: Normal breath sounds. No wheezing, rhonchi or rales.  Chest:     Chest wall: Tenderness (Diffuse TTP along right lateral and anterior ribs 8-10) present.  Musculoskeletal:     Cervical back: Neck supple.  Skin:    General: Skin is warm and dry.  Neurological:     General: No focal deficit present.     Mental Status: He is alert. Mental status is at baseline.     Motor: No weakness.     Gait: Gait normal.  Psychiatric:        Mood and Affect: Mood normal.        Behavior: Behavior normal.        Thought Content: Thought content normal.      UC Treatments / Results  Labs (all labs ordered are listed, but only abnormal results are displayed) Labs Reviewed - No data to display  EKG   Radiology DG Ribs Unilateral W/Chest Right  Result Date: 07/22/2020 CLINICAL DATA:  Right rib pain today without inciting injury. History of a fall from a ladder approximately 1 month ago. EXAM: RIGHT RIBS AND CHEST - 3+ VIEW COMPARISON:  PA and lateral chest 06/10/2008. FINDINGS: No fracture or other bone lesions are seen involving the ribs. There is no evidence of  pneumothorax or pleural effusion. Both lungs are clear. Heart size and mediastinal contours are within normal limits. IMPRESSION: Negative exam. Electronically Signed   By: Drusilla Kanner M.D.   On: 07/22/2020 19:59  Procedures Procedures (including critical care time)  Medications Ordered in UC Medications - No data to display  Initial Impression / Assessment and Plan / UC Course  I have reviewed the triage vital signs and the nursing notes.  Pertinent labs & imaging results that were available during my care of the patient were reviewed by me and considered in my medical decision making (see chart for details).   70 year old male presenting for right rib pain following a minor injury earlier today.  Admitted to a previous injury about a month ago after falling from a ladder onto the side.  Symptoms had resolved until today.  He does have tenderness to palpation of the right anterior and lateral ribs 8 through 10.  Chest is clear to auscultation heart regular rate and rhythm.  He is denying any shortness of breath or breathing difficulty.  X-ray of right ribs and chest obtained today.  X-ray independently viewed by me.  X-rays negative for fracture.  Reviewed results of x-ray with patient.  Supportive care advised.  Advised cryotherapy and Tylenol for pain.  Advised to follow-up as needed for any worsening symptoms.  ED precautions reviewed.   Final Clinical Impressions(s) / UC Diagnoses   Final diagnoses:  Rib pain on right side     Discharge Instructions     No fractures.  Supportive care at this time.  Ice the area and take Tylenol for pain.  Follow-up with your PCP if still bothering you.  You should be seen sooner if you develop any shortness of breath or worsening pain.    ED Prescriptions    None     I have reviewed the PDMP during this encounter.   Shirlee Latch, PA-C 07/22/20 2008

## 2020-07-22 NOTE — Discharge Instructions (Signed)
No fractures.  Supportive care at this time.  Ice the area and take Tylenol for pain.  Follow-up with your PCP if still bothering you.  You should be seen sooner if you develop any shortness of breath or worsening pain.

## 2021-01-06 DIAGNOSIS — R600 Localized edema: Secondary | ICD-10-CM | POA: Insufficient documentation

## 2021-01-07 ENCOUNTER — Other Ambulatory Visit: Payer: Self-pay | Admitting: Internal Medicine

## 2021-01-07 DIAGNOSIS — G4733 Obstructive sleep apnea (adult) (pediatric): Secondary | ICD-10-CM

## 2021-01-07 DIAGNOSIS — R600 Localized edema: Secondary | ICD-10-CM

## 2021-01-15 ENCOUNTER — Ambulatory Visit: Payer: Medicare Other

## 2021-01-19 ENCOUNTER — Other Ambulatory Visit: Payer: Self-pay

## 2021-01-19 ENCOUNTER — Ambulatory Visit
Admission: RE | Admit: 2021-01-19 | Discharge: 2021-01-19 | Disposition: A | Discharge status: Home / Self Care | Payer: Medicare Other | Source: Ambulatory Visit | Attending: Internal Medicine | Admitting: Internal Medicine

## 2021-01-19 ENCOUNTER — Encounter
Admission: RE | Admit: 2021-01-19 | Discharge: 2021-01-19 | Disposition: A | Discharge status: Home / Self Care | Payer: Medicare Other | Source: Ambulatory Visit | Attending: Internal Medicine | Admitting: Internal Medicine

## 2021-01-19 DIAGNOSIS — I10 Essential (primary) hypertension: Secondary | ICD-10-CM | POA: Diagnosis not present

## 2021-01-19 DIAGNOSIS — Z87891 Personal history of nicotine dependence: Secondary | ICD-10-CM | POA: Diagnosis not present

## 2021-01-19 DIAGNOSIS — R600 Localized edema: Secondary | ICD-10-CM | POA: Insufficient documentation

## 2021-01-19 DIAGNOSIS — E119 Type 2 diabetes mellitus without complications: Secondary | ICD-10-CM | POA: Diagnosis not present

## 2021-01-19 DIAGNOSIS — G4733 Obstructive sleep apnea (adult) (pediatric): Secondary | ICD-10-CM | POA: Diagnosis not present

## 2021-01-19 LAB — ECHOCARDIOGRAM COMPLETE
AR max vel: 3.96 cm2
AV Area VTI: 4.19 cm2
AV Area mean vel: 4.63 cm2
AV Mean grad: 2 mmHg
AV Peak grad: 5.2 mmHg
Ao pk vel: 1.14 m/s
Area-P 1/2: 3.93 cm2
MV VTI: 4.29 cm2
S' Lateral: 3.9 cm

## 2021-01-19 MED ORDER — TECHNETIUM TC 99M MEBROFENIN IV KIT: 5.0000 | PACK | Freq: Once | INTRAVENOUS | Status: DC | PRN

## 2021-01-19 NOTE — Progress Notes (Signed)
*  PRELIMINARY RESULTS* Echocardiogram 2D Echocardiogram has been performed.  Eric Shelton Eric Shelton 01/19/2021, 9:59 AM

## 2021-01-28 DIAGNOSIS — I5032 Chronic diastolic (congestive) heart failure: Secondary | ICD-10-CM | POA: Insufficient documentation

## 2021-02-09 ENCOUNTER — Other Ambulatory Visit (INDEPENDENT_AMBULATORY_CARE_PROVIDER_SITE_OTHER): Payer: Self-pay | Admitting: Nurse Practitioner

## 2021-02-09 DIAGNOSIS — R29898 Other symptoms and signs involving the musculoskeletal system: Secondary | ICD-10-CM

## 2021-02-09 DIAGNOSIS — R0989 Other specified symptoms and signs involving the circulatory and respiratory systems: Secondary | ICD-10-CM

## 2021-02-10 ENCOUNTER — Encounter (INDEPENDENT_AMBULATORY_CARE_PROVIDER_SITE_OTHER): Payer: Self-pay | Admitting: Nurse Practitioner

## 2021-02-10 ENCOUNTER — Ambulatory Visit (INDEPENDENT_AMBULATORY_CARE_PROVIDER_SITE_OTHER): Payer: Medicare Other

## 2021-02-10 ENCOUNTER — Other Ambulatory Visit: Payer: Self-pay

## 2021-02-10 ENCOUNTER — Ambulatory Visit (INDEPENDENT_AMBULATORY_CARE_PROVIDER_SITE_OTHER): Payer: Medicare Other | Admitting: Nurse Practitioner

## 2021-02-10 VITALS — BP 190/95 | HR 83 | Ht 73.0 in | Wt 349.0 lb

## 2021-02-10 DIAGNOSIS — M79605 Pain in left leg: Secondary | ICD-10-CM

## 2021-02-10 DIAGNOSIS — E1169 Type 2 diabetes mellitus with other specified complication: Secondary | ICD-10-CM | POA: Diagnosis not present

## 2021-02-10 DIAGNOSIS — R0989 Other specified symptoms and signs involving the circulatory and respiratory systems: Secondary | ICD-10-CM

## 2021-02-10 DIAGNOSIS — E1122 Type 2 diabetes mellitus with diabetic chronic kidney disease: Secondary | ICD-10-CM

## 2021-02-10 DIAGNOSIS — I129 Hypertensive chronic kidney disease with stage 1 through stage 4 chronic kidney disease, or unspecified chronic kidney disease: Secondary | ICD-10-CM

## 2021-02-10 DIAGNOSIS — E785 Hyperlipidemia, unspecified: Secondary | ICD-10-CM

## 2021-02-10 DIAGNOSIS — N183 Chronic kidney disease, stage 3 unspecified: Secondary | ICD-10-CM

## 2021-02-10 DIAGNOSIS — R29898 Other symptoms and signs involving the musculoskeletal system: Secondary | ICD-10-CM

## 2021-02-10 DIAGNOSIS — M79604 Pain in right leg: Secondary | ICD-10-CM

## 2021-02-15 ENCOUNTER — Encounter (INDEPENDENT_AMBULATORY_CARE_PROVIDER_SITE_OTHER): Payer: Self-pay | Admitting: Nurse Practitioner

## 2021-02-15 NOTE — Progress Notes (Signed)
Subjective:    Patient ID: Eric Shelton, male    DOB: January 12, 1951, 70 y.o.   MRN: [id number] Chief Complaint  Patient presents with   New Patient (Initial Visit)    NP ABI and consult possible claudication. Distal pulses +1 abrupt on set of weakness in lower back and legs with activity referred by feldpaush dale    Eric Shelton is a 70 year old male that presents today from his primary care physician Dr. Ellison Hughs in regards to claudication-like symptoms.  He notes that the pain has happened is started this year.  The patient notes that initially the pain happens consistently whenever he walks but lately he has not been having this pain.  He notes that initially he would have back pain and then shortly thereafter his legs feel like leg.  He denies classic claudication-like symptoms.  He also denies any rest pain or ulcerations.  Today noninvasive studies show an ABI of 1.08 on the right and 1.14 on the left.  He has triphasic tibial artery waveforms with good toe waveforms bilaterally.   Review of Systems  Musculoskeletal:  Positive for back pain and gait problem.  Neurological:  Positive for weakness.  All other systems reviewed and are negative.     Objective:   Physical Exam Vitals reviewed.  HENT:     Head: Normocephalic.  Cardiovascular:     Rate and Rhythm: Normal rate.     Pulses:          Dorsalis pedis pulses are 1+ on the right side and 1+ on the left side.  Pulmonary:     Effort: Pulmonary effort is normal.  Skin:    General: Skin is warm and dry.  Neurological:     Mental Status: He is alert and oriented to person, place, and time.  Psychiatric:        Mood and Affect: Mood normal.        Behavior: Behavior normal.        Thought Content: Thought content normal.        Judgment: Judgment normal.    BP (!) 190/95   Pulse 83   Ht 6\' 1"  (1.854 m)   Wt (!) 349 lb (158.3 kg)   BMI 46.04 kg/m   Past Medical History:  Diagnosis Date   Diabetes  mellitus without complication (HCC)    Hypercholesteremia    Hypertension     Social History   Socioeconomic History   Marital status: Married    Spouse name: Not on file   Number of children: Not on file   Years of education: Not on file   Highest education level: Not on file  Occupational History   Not on file  Tobacco Use   Smoking status: Former   Smokeless tobacco: Never  Substance and Sexual Activity   Alcohol use: Not Currently   Drug use: Not on file   Sexual activity: Not on file  Other Topics Concern   Not on file  Social History Narrative   Not on file   Social Determinants of Health   Financial Resource Strain: Not on file  Food Insecurity: Not on file  Transportation Needs: Not on file  Physical Activity: Not on file  Stress: Not on file  Social Connections: Not on file  Intimate Partner Violence: Not on file    History reviewed. No pertinent surgical history.  Family History  Problem Relation Age of Onset   Healthy Mother    Healthy Father  No Known Allergies  No flowsheet data found.    CMP  No results found for: NA, K, CL, CO2, GLUCOSE, BUN, CREATININE, CALCIUM, PROT, ALBUMIN, AST, ALT, ALKPHOS, BILITOT, GFRNONAA, GFRAA   No results found.     Assessment & Plan:   1. Leg pain, bilateral Based upon the patient's description of pain it is highly likely that this is more so related to his back versus a vascular cause.  This is largely because the pain has stopped recently without any specific interventions.  The patient also notes that when his claudication-like symptoms are happening he also has some concurrent back pain as well.  We discussed possible aortic level disease which would require evaluation of his aorta with a different ultrasound.  We also discussed further work-up of his lower back.  This time the patient wishes to work-up possible lower back pain and if there is continued issues we can have the patient's aorta evaluated to  determine if there is some level of atherosclerotic disease there as well.  Otherwise patient will follow-up on an as-needed basis.  2. 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  Patient's blood pressure is elevated today but notes that this sometimes happens when he goes to the doctor.  He is advised to check it again once he arrives home.  If remains elevated he should contact his primary care physician or seek emergency attention.  3. Hyperlipidemia associated with type 2 diabetes mellitus (HCC) Continue statin as ordered and reviewed, no changes at this time    Current Outpatient Medications on File Prior to Visit  Medication Sig Dispense Refill   allopurinol (ZYLOPRIM) 300 MG tablet Take 300 mg by mouth daily.     amLODipine (NORVASC) 10 MG tablet Take 10 mg by mouth daily.     aspirin 81 MG chewable tablet Chew by mouth daily.     cloNIDine (CATAPRES) 0.3 MG tablet Take 0.3 mg by mouth 2 (two) times daily.     cyanocobalamin 1000 MCG tablet Take by mouth.     dapagliflozin propanediol (FARXIGA) 10 MG TABS tablet Take by mouth.     Dulaglutide (TRULICITY [place]) Inject into the skin.     ENTRESTO 24-26 MG SMARTSIG:1 Tablet(s) By Mouth Every 12 Hours     furosemide (LASIX) 40 MG tablet Take by mouth.     gabapentin (NEURONTIN) 100 MG capsule Take 100 mg by mouth 3 (three) times daily.     glipiZIDE (GLUCOTROL) 10 MG tablet Take 10 mg by mouth daily before breakfast.     lovastatin (MEVACOR) 10 MG tablet Take 10 mg by mouth at bedtime.     metoprolol (TOPROL-XL) 200 MG 24 hr tablet Take 200 mg by mouth daily.     Multiple Vitamin (MULTI-VITAMIN) tablet Take 1 tablet by mouth daily.     pioglitazone (ACTOS) 15 MG tablet Take 15 mg by mouth daily.     potassium chloride (KLOR-CON) 20 MEQ packet Take by  mouth 2 (two) times daily.     pregabalin (LYRICA) 25 MG capsule TAKE 1 CAPSULE BY MOUTH TWICE DAILY FOR 1 WEEK. INCREASE TO 50 MG 2 TIMES A DAY     traZODone (DESYREL) 50 MG tablet Take by mouth.     triamterene-hydrochlorothiazide (MAXZIDE-25) 37.5-25 MG tablet Take 1 tablet by mouth daily.     valsartan (  DIOVAN) 160 MG tablet Take 160 mg by mouth daily.     No current facility-administered medications on file prior to visit.    There are no Patient Instructions on file for this visit. No follow-ups on file.   Kris Hartmann, NP

## 2022-01-05 ENCOUNTER — Other Ambulatory Visit: Payer: Self-pay | Admitting: Family Medicine

## 2022-01-05 DIAGNOSIS — M5416 Radiculopathy, lumbar region: Secondary | ICD-10-CM

## 2022-01-26 ENCOUNTER — Ambulatory Visit
Admission: RE | Admit: 2022-01-26 | Discharge: 2022-01-26 | Disposition: A | Discharge status: Home / Self Care | Payer: Medicare Other | Source: Ambulatory Visit | Attending: Family Medicine | Admitting: Family Medicine

## 2022-01-26 DIAGNOSIS — M5416 Radiculopathy, lumbar region: Secondary | ICD-10-CM | POA: Diagnosis present

## 2022-12-15 IMAGING — CR DG RIBS W/ CHEST 3+V*R*
6 series · 6 of 6 positions shown · non-contrast
Comparison: PA and lateral chest 06/10/2008.

CLINICAL DATA: Right rib pain today without inciting injury.
History of a fall from a ladder approximately 1 month ago.

EXAM:
RIGHT RIBS AND CHEST - 3+ VIEW

[chest pa (1 of 2)]
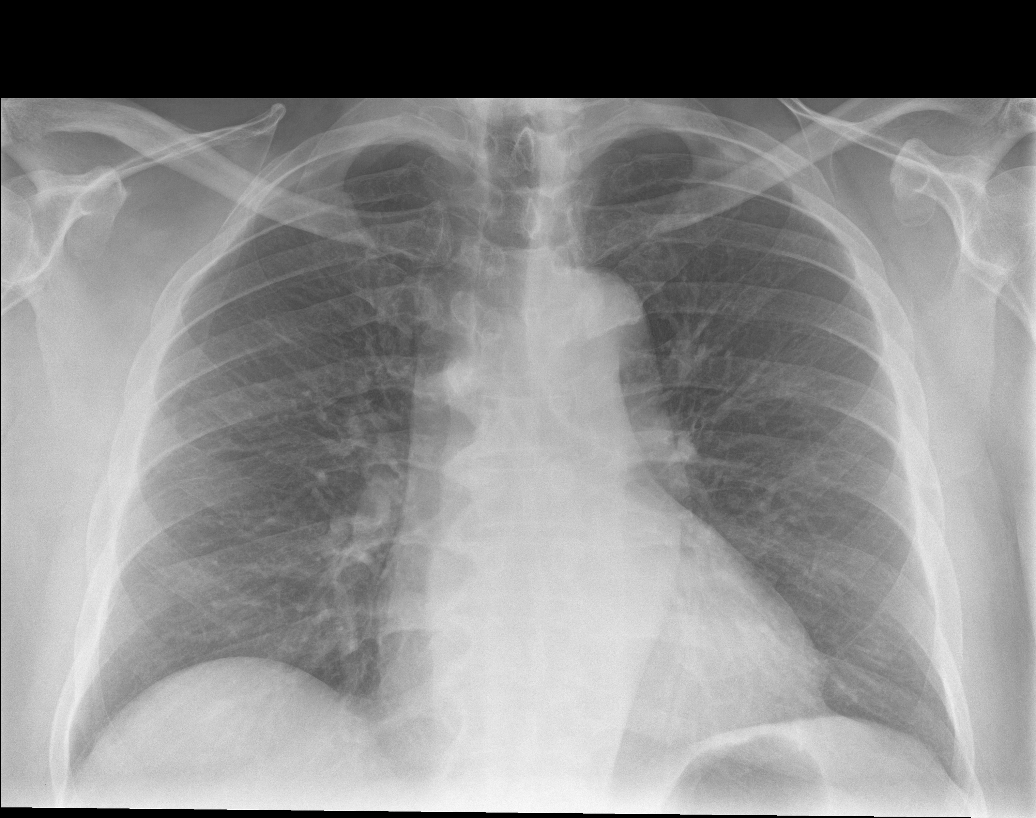

[rib pa]
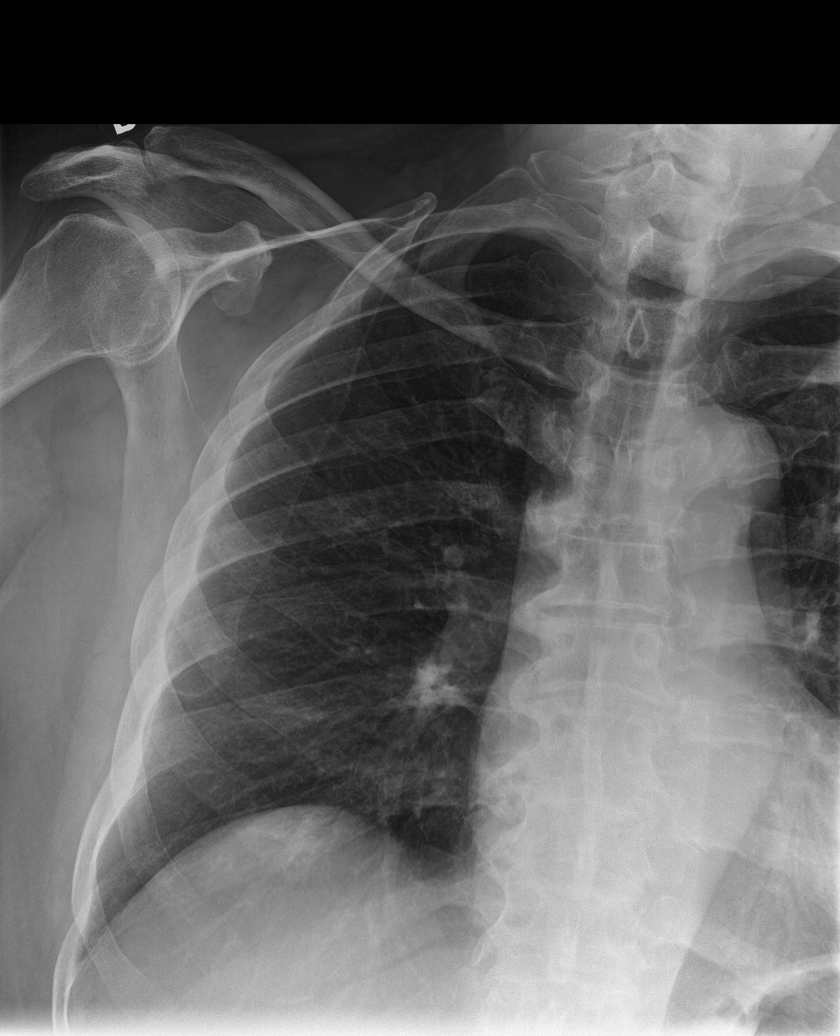

[rib obl (1 of 3)]
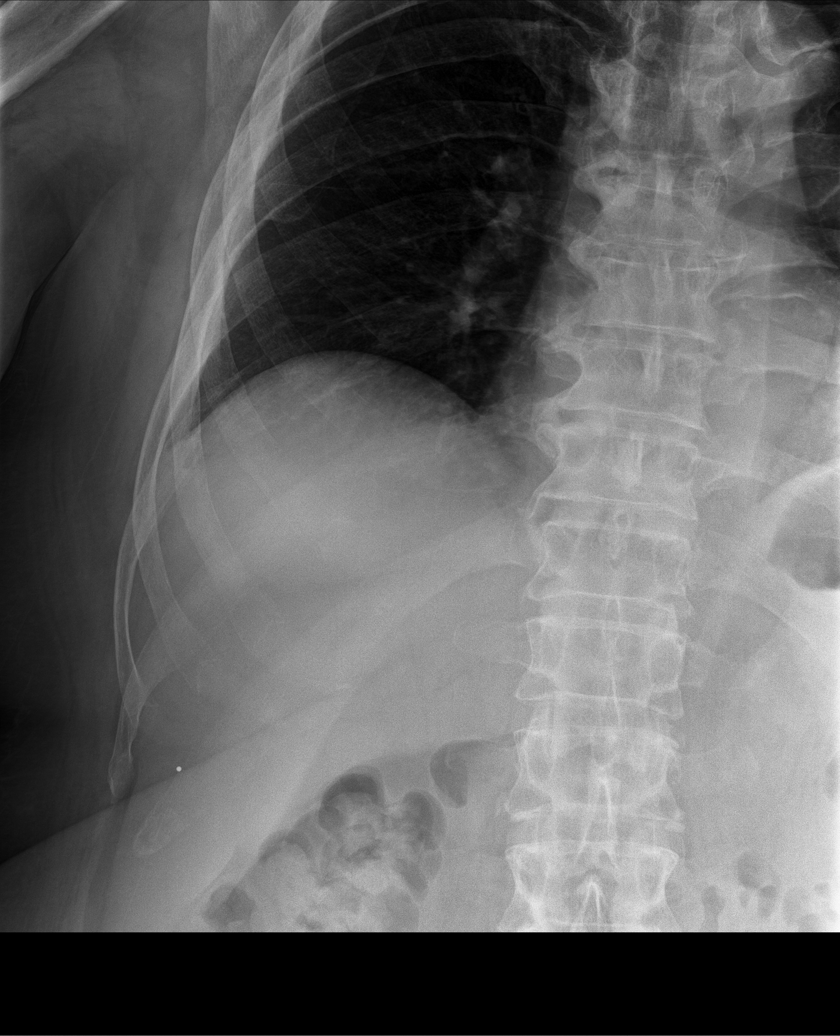

[rib obl (2 of 3)]
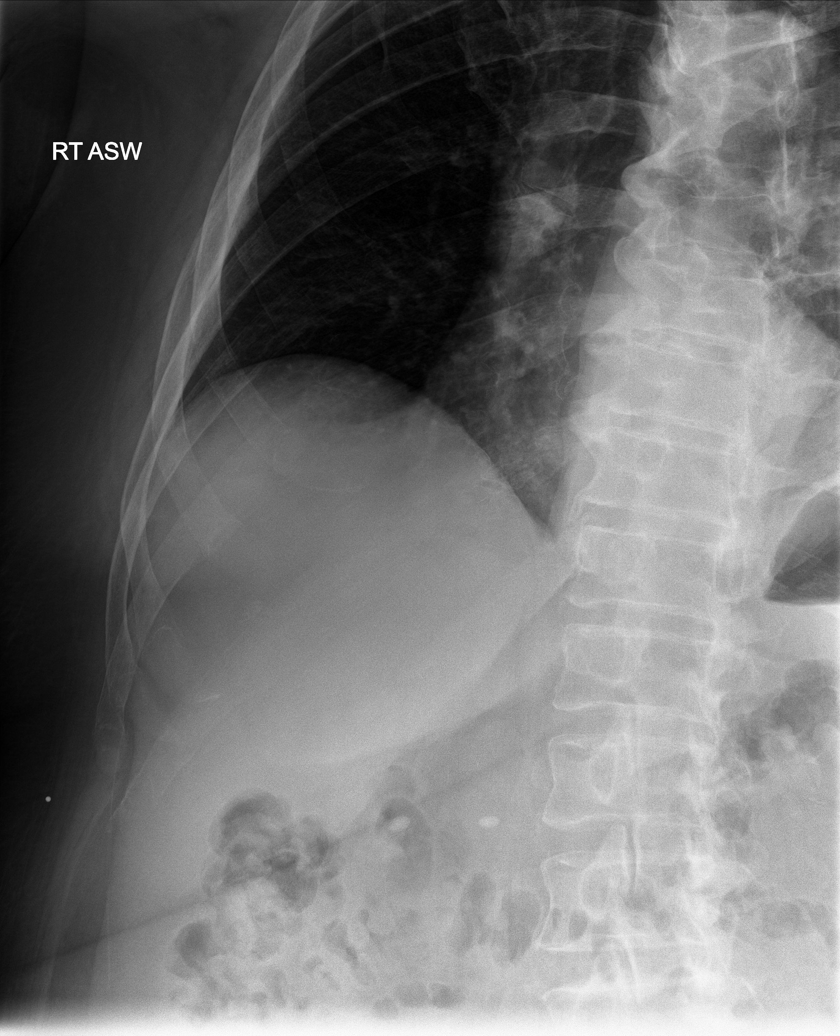

[chest pa (2 of 2)]
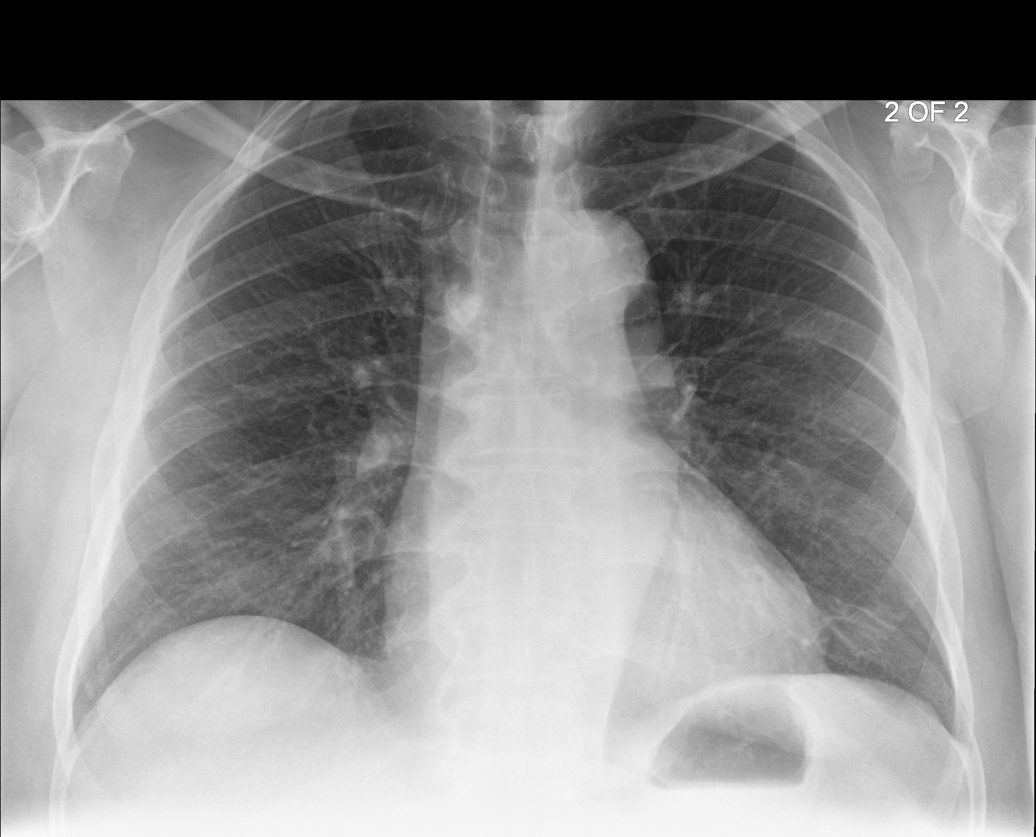

[rib obl (3 of 3)]
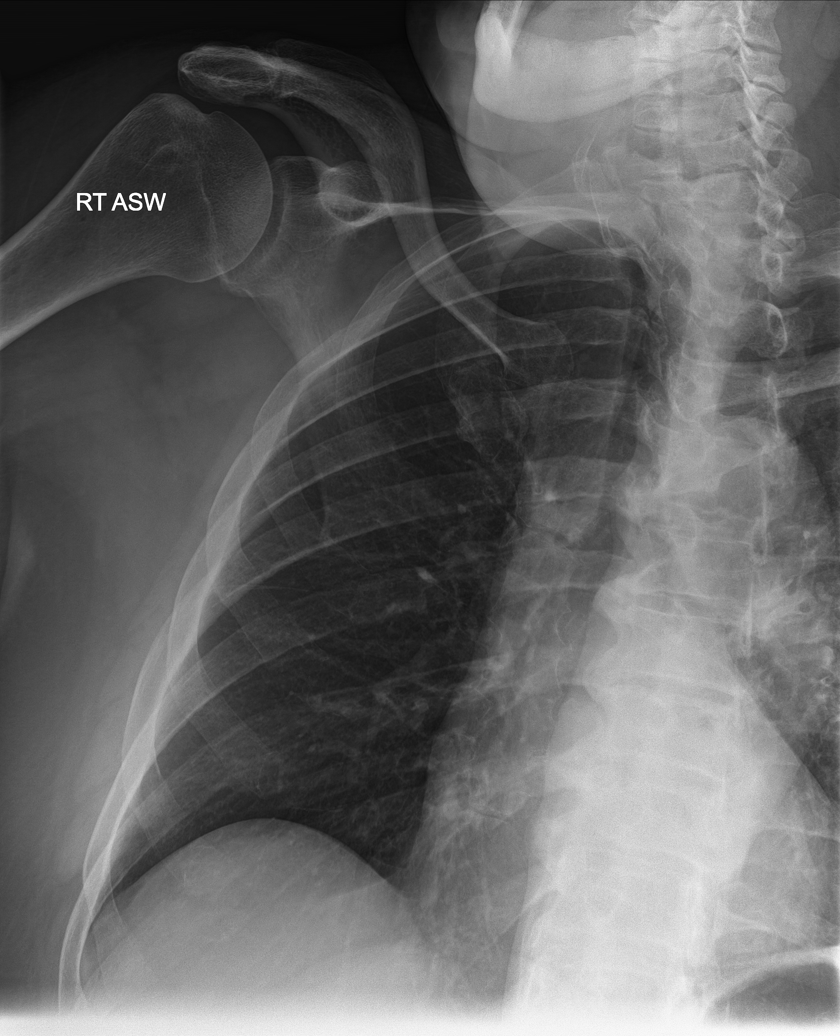

[6 of 6 positions shown; findings below may reference images not displayed]

FINDINGS: No fracture or other bone lesions are seen involving the ribs. There
is no evidence of pneumothorax or pleural effusion. Both lungs are
clear. Heart size and mediastinal contours are within normal limits.
IMPRESSION: Negative exam.
